# Patient Record
Sex: Female | Born: 2000 | Race: White | Hispanic: No | Marital: Single | Smoking: Never smoker
Health system: Southern US, Community
[De-identification: ages and names within clinical notes are randomized; demographics above are authoritative.]

---
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
## 2024-08-30 ENCOUNTER — Ambulatory Visit: Admitting: Family Medicine

## 2024-08-30 VITALS — BP 122/82 | HR 102 | Ht 60.5 in | Wt 99.4 lb

## 2024-08-30 DIAGNOSIS — Q7962 Hypermobile Ehlers-Danlos syndrome: Secondary | ICD-10-CM | POA: Insufficient documentation

## 2024-08-30 DIAGNOSIS — F3181 Bipolar II disorder: Secondary | ICD-10-CM | POA: Insufficient documentation

## 2024-08-30 NOTE — Progress Notes (Unsigned)
   LILLETTE Ileana Collet, PhD, LAT, ATC acting as a scribe for Artist Lloyd, MD.  Cindy Wallace is a 23 y.o. female who presents to [facility] today for evaluation of her hypermobility.   [facility] is [facility] a [facility]  MS: chronic back and trunk pain Skin/Immune reactions: bruises easily, stretchy skin, velvty skin Nervous system: dizziness w/ transitioning to stand Head/Spine: scoliosis, HA CV: Tachycardia, bleeds a lot GI: chronic constipation Genitourinary: none Hands & Feet: piezogenic papules  Pertinent review of systems: No fevers or chills  Relevant historical information: Bipolar 2 in remission currently treated.   Exam:  BP 122/82   Pulse (!) 102   Ht 5' 0.5 (1.537 m)   Wt 99 lb 6.4 oz (45.1 kg)   SpO2 100%   BMI 19.09 kg/m  General: Well Developed, well nourished, and in no acute distress.   MSK: Hypermobile evaluation: Positive with a Beighton score 7/9. Ehlers-Danlos evaluation is positive with a score of 6.    Lab and Radiology Results No results found for this or any previous visit (from the past 72 hours). No results found.     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  Overall she is doing pretty well and has had significant benefit from PT with [facility].  She needs a formal diagnosis and a referral so that she can get her health insurance to pay for physical therapy.  I think this is a excellent idea and we will place a referral today.  We did spend some time talking about POTS and mast cell activation syndrome as well.  I will be available for her if she needs me but overall I think she is doing great.   PDMP not reviewed this encounter. Orders Placed This Encounter  Procedures   Ambulatory referral to Physical Therapy    Referral Priority:   Routine    Referral Type:   Physical Medicine    Referral Reason:   Specialty Services Required    Requested  Specialty:   Physical Therapy    Number of Visits Requested:   1   No orders of the defined types were placed in this encounter.    Discussed warning signs or symptoms. Please see discharge instructions. Patient expresses understanding.   The above documentation has been reviewed and is accurate and complete Artist Lloyd, M.D.

## 2024-08-30 NOTE — Patient Instructions (Addendum)
 Thank you for coming in today.   I've referred you to Physical Therapy.  Let us  know if you don't hear from them in one week.   Check out the book Disjointed Navigating the Diagnosis and Management of Hypermobile Ehlers-Danlos Syndrome and Hypermobility Spectrum Disorders.   Check back as needed
